# Patient Record
Sex: Female | Born: 1980 | Race: White | Hispanic: No | Marital: Married | Smoking: Former smoker
Health system: Southern US, Community
[De-identification: ages and names within clinical notes are randomized; demographics above are authoritative.]

## PROBLEM LIST (undated history)

## (undated) DIAGNOSIS — R87619 Unspecified abnormal cytological findings in specimens from cervix uteri: Secondary | ICD-10-CM

## (undated) HISTORY — PX: WISDOM TOOTH EXTRACTION: SHX21

## (undated) HISTORY — PX: COLPOSCOPY: SHX161

## (undated) HISTORY — PX: TYMPANOSTOMY TUBE PLACEMENT: SHX32

## (undated) HISTORY — PX: TONSILLECTOMY: SUR1361

## (undated) HISTORY — DX: Unspecified abnormal cytological findings in specimens from cervix uteri: R87.619

## (undated) HISTORY — PX: CERVICAL CONIZATION W/BX: SHX1330

## (undated) HISTORY — PX: ANKLE SURGERY: SHX546

---

## 2015-06-22 ENCOUNTER — Other Ambulatory Visit (HOSPITAL_COMMUNITY): Payer: Self-pay | Admitting: Nurse Practitioner

## 2015-06-22 ENCOUNTER — Ambulatory Visit (HOSPITAL_COMMUNITY)
Admission: RE | Admit: 2015-06-22 | Discharge: 2015-06-22 | Disposition: A | Discharge status: Home / Self Care | Payer: Medicaid Other | Source: Ambulatory Visit | Attending: Nurse Practitioner | Admitting: Nurse Practitioner

## 2015-06-22 DIAGNOSIS — M79671 Pain in right foot: Secondary | ICD-10-CM | POA: Diagnosis present

## 2016-03-13 ENCOUNTER — Encounter (HOSPITAL_COMMUNITY): Payer: Self-pay

## 2016-03-13 ENCOUNTER — Emergency Department (HOSPITAL_COMMUNITY): Payer: Medicaid Other

## 2016-03-13 ENCOUNTER — Emergency Department (HOSPITAL_COMMUNITY)
Admission: EM | Admit: 2016-03-13 | Discharge: 2016-03-14 | Disposition: A | Discharge status: Home / Self Care | Payer: Medicaid Other | Attending: Emergency Medicine | Admitting: Emergency Medicine

## 2016-03-13 DIAGNOSIS — N390 Urinary tract infection, site not specified: Secondary | ICD-10-CM | POA: Insufficient documentation

## 2016-03-13 DIAGNOSIS — R319 Hematuria, unspecified: Secondary | ICD-10-CM | POA: Diagnosis not present

## 2016-03-13 DIAGNOSIS — R1110 Vomiting, unspecified: Secondary | ICD-10-CM | POA: Diagnosis present

## 2016-03-13 DIAGNOSIS — R197 Diarrhea, unspecified: Secondary | ICD-10-CM | POA: Diagnosis not present

## 2016-03-13 LAB — DIFFERENTIAL
BASOS ABS: 0 10*3/uL (ref 0.0–0.1)
Basophils Relative: 0 %
EOS PCT: 0 %
Eosinophils Absolute: 0 10*3/uL (ref 0.0–0.7)
LYMPHS PCT: 11 %
Lymphs Abs: 1.2 10*3/uL (ref 0.7–4.0)
Monocytes Absolute: 0.6 10*3/uL (ref 0.1–1.0)
Monocytes Relative: 5 %
NEUTROS ABS: 9.5 10*3/uL — AB (ref 1.7–7.7)
NEUTROS PCT: 84 %

## 2016-03-13 LAB — URINALYSIS, ROUTINE W REFLEX MICROSCOPIC
Bilirubin Urine: NEGATIVE
Glucose, UA: NEGATIVE mg/dL
Ketones, ur: >80 mg/dL — AB
NITRITE: POSITIVE — AB
PH: 6 (ref 5.0–8.0)
Protein, ur: NEGATIVE mg/dL
Specific Gravity, Urine: 1.02 (ref 1.005–1.030)

## 2016-03-13 LAB — COMPREHENSIVE METABOLIC PANEL
ALT: 14 U/L (ref 14–54)
ANION GAP: 8 (ref 5–15)
AST: 23 U/L (ref 15–41)
Albumin: 4.3 g/dL (ref 3.5–5.0)
Alkaline Phosphatase: 53 U/L (ref 38–126)
BUN: 8 mg/dL (ref 6–20)
CHLORIDE: 104 mmol/L (ref 101–111)
CO2: 24 mmol/L (ref 22–32)
CREATININE: 0.86 mg/dL (ref 0.44–1.00)
Calcium: 9.1 mg/dL (ref 8.9–10.3)
Glucose, Bld: 97 mg/dL (ref 65–99)
Potassium: 3.9 mmol/L (ref 3.5–5.1)
Sodium: 136 mmol/L (ref 135–145)
Total Bilirubin: 0.6 mg/dL (ref 0.3–1.2)
Total Protein: 8.5 g/dL — ABNORMAL HIGH (ref 6.5–8.1)

## 2016-03-13 LAB — CBC
HCT: 40 % (ref 36.0–46.0)
Hemoglobin: 13 g/dL (ref 12.0–15.0)
MCH: 28.8 pg (ref 26.0–34.0)
MCHC: 32.5 g/dL (ref 30.0–36.0)
MCV: 88.5 fL (ref 78.0–100.0)
Platelets: 199 10*3/uL (ref 150–400)
RBC: 4.52 MIL/uL (ref 3.87–5.11)
RDW: 12.1 % (ref 11.5–15.5)
WBC: 11.5 10*3/uL — AB (ref 4.0–10.5)

## 2016-03-13 LAB — HCG, QUANTITATIVE, PREGNANCY

## 2016-03-13 LAB — LIPASE, BLOOD: LIPASE: 16 U/L (ref 11–51)

## 2016-03-13 LAB — URINE MICROSCOPIC-ADD ON

## 2016-03-13 MED ORDER — ONDANSETRON HCL 4 MG/2ML IJ SOLN
4.0000 mg | Freq: Once | INTRAMUSCULAR | Status: AC
  Administered 2016-03-13: 4 mg via INTRAVENOUS
  Filled 2016-03-13: qty 2

## 2016-03-13 MED ORDER — SODIUM CHLORIDE 0.9 % IV BOLUS (SEPSIS)
1000.0000 mL | Freq: Once | INTRAVENOUS | Status: AC
  Administered 2016-03-13: 1000 mL via INTRAVENOUS

## 2016-03-13 MED ORDER — DIATRIZOATE MEGLUMINE & SODIUM 66-10 % PO SOLN
ORAL | Status: AC
  Filled 2016-03-13: qty 30

## 2016-03-13 MED ORDER — SODIUM CHLORIDE 0.9 % IV SOLN
INTRAVENOUS | Status: DC
  Administered 2016-03-13: via INTRAVENOUS

## 2016-03-13 MED ORDER — ONDANSETRON HCL 4 MG/2ML IJ SOLN
4.0000 mg | Freq: Once | INTRAMUSCULAR | Status: AC | PRN
  Administered 2016-03-13: 4 mg via INTRAVENOUS
  Filled 2016-03-13: qty 2

## 2016-03-13 MED ORDER — MORPHINE SULFATE (PF) 4 MG/ML IV SOLN
4.0000 mg | Freq: Once | INTRAVENOUS | Status: AC
  Administered 2016-03-13: 4 mg via INTRAVENOUS
  Filled 2016-03-13: qty 1

## 2016-03-13 NOTE — ED Notes (Signed)
Pt reports 2 episodes of D/ since arriving to ED.

## 2016-03-13 NOTE — ED Provider Notes (Signed)
By signing my name below, I, [name], attest that this documentation has been prepared under the direction and in the presence of [name], DO. Electronically Signed: [name], ED Scribe. 03/13/2016. 11:56 PM.   TIME SEEN: 11:20 PM   CHIEF COMPLAINT: Abdominal pain  HPI:  Erin Melton is a 35 y.o. female with no significant past history who presents to the Emergency Department complaining of gradually worsening constant sharp left lower abdominal pain that intermittently moves to her right lower abdomen onset 2 days ago. She reports associated multiple episodes of non-bloody vomiting, one episode of non-bloody diarrhea, and a fever of 101.2 PTA. She adds that she has had nasal congestion for several days. No alleviating factors noted. Pt has tried motrin PTA with no significant relief. Pt receive Zofran here in the ED and denies any relief. She states that her LNMP was approx. 2 weeks ago. She explains that she had itchiness with codeine when she was younger. She denies a hx of STD or abdominal surgeries. She also denies any vaginal discharge, vaginal bleeding, or dysuria, hematuria. No recent sick contacts with similar symptoms or travel.  ROS: See HPI Constitutional: fever  Eyes: no drainage  ENT: no runny nose   Cardiovascular:  no chest pain  Resp: no SOB  GI: vomiting GU: no dysuria Integumentary: no rash  Allergy: no hives  Musculoskeletal: no leg swelling  Neurological: no slurred speech ROS otherwise negative  PAST MEDICAL HISTORY/PAST SURGICAL HISTORY:  History reviewed. No pertinent past medical history.  MEDICATIONS:  Prior to Admission medications   Medication Sig Start Date End Date Taking? Authorizing Provider  ibuprofen (ADVIL,MOTRIN) 800 MG tablet Take 800 mg by mouth every 8 (eight) hours as needed for mild pain.   Yes Historical Provider, MD  ondansetron (ZOFRAN) 4 MG tablet Take 4 mg by mouth every 8 (eight) hours as needed for nausea or  vomiting.   Yes Historical Provider, MD    ALLERGIES:  Allergies  Allergen Reactions  . Codeine Hives    SOCIAL HISTORY:  Social History  Substance Use Topics  . Smoking status: Never Smoker   . Smokeless tobacco: Not on file  . Alcohol Use: Not on file    FAMILY HISTORY: No family history on file.  EXAM: BP 122/82 mmHg  Pulse 87  Temp(Src) 98.6 F (37 C) (Oral)  Resp 14  Ht  (1.6 m)  Wt 153 lb (69.4 kg)  BMI 27.11 kg/m2  SpO2 100%  LMP 02/28/2016 CONSTITUTIONAL: Alert and oriented and responds appropriately to questions. Appears uncomfortable; well-nourished, afebrile, non-toxic HEAD: Normocephalic EYES: Conjunctivae clear, PERRL ENT: normal nose; no rhinorrhea; dry mucous membranes NECK: Supple, no meningismus, no LAD  CARD: RRR; S1 and S2 appreciated; no murmurs, no clicks, no rubs, no gallops RESP: Normal chest excursion without splinting or tachypnea; breath sounds clear and equal bilaterally; no wheezes, no rhonchi, no rales, no hypoxia or respiratory distress, speaking full sentences ABD/GI: Normal bowel sounds; non-distended; soft, tender in RLQ at McBurney's pt with voluntary guarding, no rebound, no peritoneal signs BACK:  The back appears normal and is non-tender to palpation, there is no CVA tenderness EXT: Normal ROM in all joints; non-tender to palpation; no edema; normal capillary refill; no cyanosis, no calf tenderness or swelling    SKIN: Normal color for age and race; warm; no rash NEURO: Moves all extremities equally, sensation to light touch intact diffusely, cranial nerves II through XII intact PSYCH: The patient's mood and manner  are appropriate. Grooming and personal hygiene are appropriate.  MEDICAL DECISION MAKING: Patient here with complaints of fever, abdominal pain, vomiting and diarrhea. Reports she is mostly tender in the left lower quadrant but on examination she is actually more tender to palpation in the right lower quadrant with  voluntary guarding at McBurney's point. Concern for possible appendicitis. No GU symptoms present. No flank pain. Labs show mild leukocytosis. She is not pregnant. Urine does show nitrite positive urinary tract infection which could also be contributing to her symptoms. We'll send urine culture. We'll obtain CT of her abdomen and pelvis to rule out appendicitis.  ED PROGRESS: 1:40 AM  Pt reports feeling much better. Her labs show mild leukocytosis with left shift. Urine shows a nitrite positive urinary tract infection. Culture is pending. CT scan does not show any acute intra-abdominal or pelvic pathology. Suboptimal visualization of the appendix but there is no inflammatory changes in the right lower quadrant. Ovaries appear normal. She does not have pelvic pain on exam. Most of her pain is suprapubic, right lower abdomen. She does report that she is having urinary frequency but again denies dysuria, hematuria. Suspect this pain is from her urinary tract infection. She is receiving IV ceftriaxone. She reports feeling better and is ready for discharge home. She does not currently have a PCP. We'll give her outpatient PCP follow-up information. Discussed return precautions with patient and significant other bedside.   At this time, I do not feel there is any life-threatening condition present. I have reviewed and discussed all results (EKG, imaging, lab, urine as appropriate), exam findings with patient. I have reviewed nursing notes and appropriate previous records.  I feel the patient is safe to be discharged home without further emergent workup. Discussed usual and customary return precautions. Patient and family (if present) verbalize understanding and are comfortable with this plan.  Patient will follow-up with their primary care provider. If they do not have a primary care provider, information for follow-up has been provided to them. All questions have been answered.    I personally performed the  services described in this documentation, which was scribed in my presence. The recorded information has been reviewed and is accurate.   Erin MawKristen N Heavin Sebree, DO 03/14/16 [id number]

## 2016-03-13 NOTE — ED Notes (Signed)
Fever and vomiting X2 days. Motrin PTA at [id number]

## 2016-03-14 MED ORDER — MORPHINE SULFATE (PF) 4 MG/ML IV SOLN
4.0000 mg | Freq: Once | INTRAVENOUS | Status: AC
  Administered 2016-03-14: 4 mg via INTRAVENOUS
  Filled 2016-03-14: qty 1

## 2016-03-14 MED ORDER — PHENAZOPYRIDINE HCL 200 MG PO TABS: 200.0000 mg | ORAL_TABLET | Freq: Three times a day (TID) | ORAL | Status: DC

## 2016-03-14 MED ORDER — DEXTROSE 5 % IV SOLN
1.0000 g | Freq: Once | INTRAVENOUS | Status: AC
  Administered 2016-03-14: 1 g via INTRAVENOUS
  Filled 2016-03-14: qty 10

## 2016-03-14 MED ORDER — IOPAMIDOL (ISOVUE-300) INJECTION 61%
100.0000 mL | Freq: Once | INTRAVENOUS | Status: AC | PRN
  Administered 2016-03-14: 100 mL via INTRAVENOUS

## 2016-03-14 MED ORDER — IBUPROFEN 800 MG PO TABS: 800.0000 mg | ORAL_TABLET | Freq: Three times a day (TID) | ORAL | Status: AC | PRN

## 2016-03-14 MED ORDER — ONDANSETRON 4 MG PO TBDP: 4.0000 mg | ORAL_TABLET | Freq: Three times a day (TID) | ORAL | Status: DC | PRN

## 2016-03-14 MED ORDER — CEPHALEXIN 500 MG PO CAPS: 500.0000 mg | ORAL_CAPSULE | Freq: Four times a day (QID) | ORAL | Status: DC

## 2016-03-14 NOTE — Discharge Instructions (Signed)

## 2016-03-16 LAB — URINE CULTURE

## 2016-03-17 ENCOUNTER — Telehealth (HOSPITAL_BASED_OUTPATIENT_CLINIC_OR_DEPARTMENT_OTHER): Payer: Self-pay | Admitting: Emergency Medicine

## 2016-03-17 NOTE — Telephone Encounter (Signed)
Post ED Visit - Positive Culture Follow-up  Culture report reviewed by antimicrobial stewardship pharmacist:  [x]  Erin Melton, Pharm.D. []  Erin Melton, Pharm.D., BCPS []  Erin Melton, Pharm.D. []  Erin Melton, Pharm.D., BCPS []  Erin Melton, [place] BCPS, AAHIVP []  Erin Melton, Pharm.D., BCPS, AAHIVP []  Erin Melton, Pharm.D. []  Erin Melton, [address]Pharm[address]  Positive urine culture Treated with  cephalexin, organism sensitive to the same and no further patient follow-up is required at this time.  Erin Melton, Erin Melton 03/17/2016, 9:57 AM

## 2016-03-28 ENCOUNTER — Ambulatory Visit (INDEPENDENT_AMBULATORY_CARE_PROVIDER_SITE_OTHER): Payer: Medicaid Other | Admitting: Obstetrics and Gynecology

## 2016-03-28 ENCOUNTER — Encounter: Payer: Self-pay | Admitting: Obstetrics and Gynecology

## 2016-03-28 VITALS — BP 120/74 | Ht 64.0 in | Wt 143.0 lb

## 2016-03-28 DIAGNOSIS — N814 Uterovaginal prolapse, unspecified: Secondary | ICD-10-CM

## 2016-03-28 DIAGNOSIS — N941 Unspecified dyspareunia: Secondary | ICD-10-CM | POA: Diagnosis not present

## 2016-03-28 DIAGNOSIS — R1030 Lower abdominal pain, unspecified: Secondary | ICD-10-CM

## 2016-03-28 DIAGNOSIS — R1032 Left lower quadrant pain: Secondary | ICD-10-CM

## 2016-03-28 NOTE — Progress Notes (Signed)
[facility] Visit  03/28/2016            Patient name: Erin Melton MRN [id number]  Date of birth: [birth year]  CC & HPI:  Erin Melton is a 35 y.o. female presenting today for follow up from ED visit on 03/13/2016 due to LLQ pain. Pt had a negative CT scan and UA culture and dx with k. penumoniae UTI. Pt was Rx 5 day keflex for her symptoms and she has finished her Rx at this time. Pt reports that she has had abdominal cramping x 8 months since placement of her nexplanon, pt had the nexplanon removed. Pt states that she has had 2 vaginal deliveries. Pt states that her abdominal pain feels similar to a contraction constantly throughout the day. Pt reports that her pain will intermittently last seconds to 30 minutes per episode and it is worsened at the end of the day. Pt has associated symptoms of dyspareunia. Pt has tried laxatives for the relief of her symptoms. Denies any other symptoms. Pt uses the pill as her contraceptive measures. Pt is in a stable marriage at this time.  ROS:  ROS +LLQ abdominal pain  Pertinent History Reviewed:   Reviewed: Significant for  Medical         Past Medical History  Diagnosis Date  . Abnormal Pap smear of cervix     at age 35                               Surgical Hx:    Past Surgical History  Procedure Laterality Date  . Tonsillectomy    . Cervical conization w/bx      at age 35   . Colposcopy    . Tympanostomy tube placement    . Tonsillectomy    . Ankle surgery    . Wisdom tooth extraction     Medications: Reviewed & Updated - see associated section                       Current outpatient prescriptions:  .  ibuprofen (ADVIL,MOTRIN) 800 MG tablet, Take 1 tablet (800 mg total) by mouth every 8 (eight) hours as needed for mild pain., Disp: 30 tablet, Rfl: 0 .  ondansetron (ZOFRAN ODT) 4 MG disintegrating tablet, Take 1 tablet (4 mg total) by mouth every 8 (eight) hours as needed for nausea or vomiting., Disp: 20 tablet, Rfl:  0   Social History: Reviewed -  reports that she has quit smoking. She has never used smokeless tobacco.  Objective Findings:  Vitals: Blood pressure 120/74, height 5\' 4"  (1.626 m), weight 143 lb (64.864 kg), last menstrual period 03/21/2016.  Physical Examination: Pelvic - normal external genitalia, vulva, vagina, cervix, uterus and adnexa, VULVA: normal appearing vulva with no masses, tenderness or lesions,  VAGINA: normal appearing vagina with normal color and discharge, no lesions,  CERVIX: normal appearing cervix without discharge or lesions, Cervix is somewhat enlarged prominent and easily contacted during pelvic exam. First-degree uterine descensus is present and uterus is mid position slightly anteflexed and elevation of the uterus seems to reproduce the pelvic discomfort as well as the dyspareunia UTERUS: uterus is normal size, shape, consistency and nontender, mid position 6 weeks size. Elevation of the uterus reproduces the discomfort  ADNEXA: normal adnexa in size, nontender and no masses   Assessment & Plan:   A:  1. Pelvic Relaxation, First-degree uterine  descensus. Pelvic discomfort due to this descensus 2. Dyspareunia secondary to uterine contact  P:  1. Anatomy and examination explained with visual guidance 2 Motrin when necessary position changes advised follow-up as needed for further discussion may bring partner for future exams   By signing my name below, I, Soijett Blue, attest that this documentation has been prepared under the direction and in the presence of Tilda BurrowJohn V Saryna Kneeland, MD. Electronically Signed: Soijett Blue, ED Scribe. 03/28/2016. 1:38 PM.  I personally performed the services described in this documentation, which was SCRIBED in my presence. The recorded information has been reviewed and considered accurate. It has been edited as necessary during review. Tilda BurrowFERGUSON,Daleisa Halperin V, MD

## 2016-03-28 NOTE — Progress Notes (Signed)
Patient ID: Erin Melton, female   DOB: 12-Jan-1981, 35 y.o.   MRN: [id number] Erin Melton states that she is still having abdominal, near both ovaries. Erin Melton states that a CT scan was done but no US.

## 2016-04-01 LAB — GC/CHLAMYDIA PROBE AMP
Chlamydia trachomatis, NAA: NEGATIVE
NEISSERIA GONORRHOEAE BY PCR: NEGATIVE

## 2016-07-07 ENCOUNTER — Other Ambulatory Visit: Payer: Self-pay | Admitting: Sports Medicine

## 2016-07-07 DIAGNOSIS — M25552 Pain in left hip: Secondary | ICD-10-CM

## 2016-07-07 DIAGNOSIS — R531 Weakness: Secondary | ICD-10-CM

## 2016-12-31 ENCOUNTER — Encounter (HOSPITAL_COMMUNITY): Payer: Self-pay | Admitting: Radiology

## 2016-12-31 ENCOUNTER — Inpatient Hospital Stay (HOSPITAL_COMMUNITY)
Admission: AD | Admit: 2016-12-31 | Discharge: 2017-01-01 | Disposition: A | Discharge status: Home / Self Care | Payer: BLUE CROSS/BLUE SHIELD | Source: Ambulatory Visit | Attending: Obstetrics and Gynecology | Admitting: Obstetrics and Gynecology

## 2016-12-31 ENCOUNTER — Inpatient Hospital Stay (HOSPITAL_COMMUNITY): Payer: BLUE CROSS/BLUE SHIELD

## 2016-12-31 DIAGNOSIS — A084 Viral intestinal infection, unspecified: Secondary | ICD-10-CM | POA: Diagnosis not present

## 2016-12-31 DIAGNOSIS — R1032 Left lower quadrant pain: Secondary | ICD-10-CM | POA: Diagnosis not present

## 2016-12-31 DIAGNOSIS — Z87891 Personal history of nicotine dependence: Secondary | ICD-10-CM | POA: Insufficient documentation

## 2016-12-31 DIAGNOSIS — K5730 Diverticulosis of large intestine without perforation or abscess without bleeding: Secondary | ICD-10-CM | POA: Insufficient documentation

## 2016-12-31 DIAGNOSIS — R109 Unspecified abdominal pain: Secondary | ICD-10-CM | POA: Diagnosis present

## 2016-12-31 LAB — CBC
HEMATOCRIT: 40.1 % (ref 36.0–46.0)
HEMOGLOBIN: 13.8 g/dL (ref 12.0–15.0)
MCH: 29.4 pg (ref 26.0–34.0)
MCHC: 34.4 g/dL (ref 30.0–36.0)
MCV: 85.3 fL (ref 78.0–100.0)
Platelets: 272 10*3/uL (ref 150–400)
RBC: 4.7 MIL/uL (ref 3.87–5.11)
RDW: 12.4 % (ref 11.5–15.5)
WBC: 11 10*3/uL — ABNORMAL HIGH (ref 4.0–10.5)

## 2016-12-31 LAB — COMPREHENSIVE METABOLIC PANEL
ALK PHOS: 77 U/L (ref 38–126)
ALT: 17 U/L (ref 14–54)
ANION GAP: 14 (ref 5–15)
AST: 25 U/L (ref 15–41)
Albumin: 4.3 g/dL (ref 3.5–5.0)
BILIRUBIN TOTAL: 1.4 mg/dL — AB (ref 0.3–1.2)
BUN: 13 mg/dL (ref 6–20)
CALCIUM: 9.5 mg/dL (ref 8.9–10.3)
CO2: 21 mmol/L — ABNORMAL LOW (ref 22–32)
Chloride: 102 mmol/L (ref 101–111)
Creatinine, Ser: 0.66 mg/dL (ref 0.44–1.00)
GFR calc non Af Amer: >60 mL/min (ref >60)
GLUCOSE: 96 mg/dL (ref 65–99)
Potassium: 3.6 mmol/L (ref 3.5–5.1)
Sodium: 137 mmol/L (ref 135–145)
TOTAL PROTEIN: 8.4 g/dL — AB (ref 6.5–8.1)

## 2016-12-31 LAB — URINALYSIS, ROUTINE W REFLEX MICROSCOPIC
Bilirubin Urine: NEGATIVE
GLUCOSE, UA: NEGATIVE mg/dL
Ketones, ur: 20 mg/dL — AB
NITRITE: NEGATIVE
Protein, ur: 30 mg/dL — AB
SPECIFIC GRAVITY, URINE: 1.025 (ref 1.005–1.030)
pH: 5 (ref 5.0–8.0)

## 2016-12-31 LAB — POCT PREGNANCY, URINE: Preg Test, Ur: NEGATIVE

## 2016-12-31 MED ORDER — DICYCLOMINE HCL 10 MG PO CAPS
20.0000 mg | ORAL_CAPSULE | Freq: Once | ORAL | Status: AC
  Administered 2016-12-31: 20 mg via ORAL
  Filled 2016-12-31: qty 2

## 2016-12-31 MED ORDER — IOPAMIDOL (ISOVUE-300) INJECTION 61%
100.0000 mL | Freq: Once | INTRAVENOUS | Status: AC | PRN
  Administered 2016-12-31: 100 mL via INTRAVENOUS

## 2016-12-31 MED ORDER — ONDANSETRON HCL 4 MG/2ML IJ SOLN
4.0000 mg | Freq: Once | INTRAMUSCULAR | Status: AC
  Administered 2016-12-31: 4 mg via INTRAVENOUS
  Filled 2016-12-31: qty 2

## 2016-12-31 MED ORDER — KETOROLAC TROMETHAMINE 60 MG/2ML IM SOLN
60.0000 mg | Freq: Once | INTRAMUSCULAR | Status: AC
  Administered 2016-12-31: 60 mg via INTRAMUSCULAR
  Filled 2016-12-31: qty 2

## 2016-12-31 MED ORDER — SODIUM CHLORIDE 0.9 % IV BOLUS (SEPSIS)
1000.0000 mL | Freq: Once | INTRAVENOUS | Status: AC
  Administered 2016-12-31: 1000 mL via INTRAVENOUS

## 2016-12-31 MED ORDER — HYDROMORPHONE HCL 1 MG/ML IJ SOLN
1.0000 mg | Freq: Once | INTRAMUSCULAR | Status: AC
Start: 2016-12-31 — End: 2016-12-31
  Administered 2016-12-31: 1 mg via INTRAVENOUS
  Filled 2016-12-31: qty 1

## 2016-12-31 NOTE — Discharge Instructions (Signed)
Food Choices to Help Relieve Diarrhea, Adult °When you have diarrhea, the foods you eat and your eating habits are very important. Choosing the right foods and drinks can help: °· Relieve diarrhea. °· Replace lost fluids and nutrients. °· Prevent dehydration. °What general guidelines should I follow? °Relieving diarrhea  °· Choose foods with less than 2 g or .07 oz. of fiber per serving. °· Limit fats to less than 8 tsp (38 g or 1.34 oz.) a day. °· Avoid the following: °¨ Foods and beverages sweetened with high-fructose corn syrup, honey, or sugar alcohols such as xylitol, sorbitol, and mannitol. °¨ Foods that contain a lot of fat or sugar. °¨ Fried, greasy, or spicy foods. °¨ High-fiber grains, breads, and cereals. °¨ Raw fruits and vegetables. °· Eat foods that are rich in probiotics. These foods include dairy products such as yogurt and fermented milk products. They help increase healthy bacteria in the stomach and intestines (gastrointestinal tract, or GI tract). °· If you have lactose intolerance, avoid dairy products. These may make your diarrhea worse. °· Take medicine to help stop diarrhea (antidiarrheal medicine) only as told by your health care provider. °Replacing nutrients  °· Eat small meals or snacks every 3-4 hours. °· Eat bland foods, such as white rice, toast, or baked potato, until your diarrhea starts to get better. Gradually reintroduce nutrient-rich foods as tolerated or as told by your health care provider. This includes: °¨ Well-cooked protein foods. °¨ Peeled, seeded, and soft-cooked fruits and vegetables. °¨ Low-fat dairy products. °· Take vitamin and mineral supplements as told by your health care provider. °Preventing dehydration  ° °· Start by sipping water or a special solution to prevent dehydration (oral rehydration solution, ORS). Urine that is clear or pale yellow means that you are getting enough fluid. °· Try to drink at least 8-10 cups of fluid each day to help replace lost  fluids. °· You may add other liquids in addition to water, such as clear juice or decaffeinated sports drinks, as tolerated or as told by your health care provider. °· Avoid drinks with caffeine, such as coffee, tea, or soft drinks. °· Avoid alcohol. °What foods are recommended? °The items listed may not be a complete list. Talk with your health care provider about what dietary choices are best for you. °Grains  °White rice. White, French, or pita breads (fresh or toasted), including plain rolls, buns, or bagels. White pasta. Saltine, soda, or graham crackers. Pretzels. Low-fiber cereal. Cooked cereals made with water (such as cornmeal, farina, or cream cereals). Plain muffins. Matzo. Melba toast. Zwieback. °Vegetables  °Potatoes (without the skin). Most well-cooked and canned vegetables without skins or seeds. Tender lettuce. °Fruits  °Apple sauce. Fruits canned in juice. Cooked apricots, cherries, grapefruit, peaches, pears, or plums. Fresh bananas and cantaloupe. °Meats and other protein foods  °Baked or boiled chicken. Eggs. Tofu. Fish. Seafood. Smooth nut butters. Ground or well-cooked tender beef, ham, veal, lamb, pork, or poultry. °Dairy  °Plain yogurt, kefir, and unsweetened liquid yogurt. Lactose-free milk, buttermilk, skim milk, or soy milk. Low-fat or nonfat hard cheese. °Beverages  °Water. Low-calorie sports drinks. Fruit juices without pulp. Strained tomato and vegetable juices. Decaffeinated teas. Sugar-free beverages not sweetened with sugar alcohols. Oral rehydration solutions, if approved by your health care provider. °Seasoning and other foods  °Bouillon, broth, or soups made from recommended foods. °What foods are not recommended? °The items listed may not be a complete list. Talk with your health care provider about what dietary choices   are best for you. Grains  Whole grain, whole wheat, bran, or rye breads, rolls, pastas, and crackers. Wild or brown rice. Whole grain or bran cereals. Barley.  Oats and oatmeal. Corn tortillas or taco shells. Granola. Popcorn. Vegetables  Raw vegetables. Fried vegetables. Cabbage, broccoli, Brussels sprouts, artichokes, baked beans, beet greens, corn, kale, legumes, peas, sweet potatoes, and yams. Potato skins. Cooked spinach and cabbage. Fruits  Dried fruit, including raisins and dates. Raw fruits. Stewed or dried prunes. Canned fruits with syrup. Meat and other protein foods  Fried or fatty meats. Deli meats. Chunky nut butters. Nuts and seeds. Beans and lentils. Tomasa Blase. Hot dogs. Sausage. Dairy  High-fat cheeses. Whole milk, chocolate milk, and beverages made with milk, such as milk shakes. Half-and-half. Cream. sour cream. Ice cream. Beverages  Caffeinated beverages (such as coffee, tea, soda, or energy drinks). Alcoholic beverages. Fruit juices with pulp. Prune juice. Soft drinks sweetened with high-fructose corn syrup or sugar alcohols. High-calorie sports drinks. Fats and oils  Butter. Cream sauces. Margarine. Salad oils. Plain salad dressings. Olives. Avocados. Mayonnaise. Sweets and desserts  Sweet rolls, doughnuts, and sweet breads. Sugar-free desserts sweetened with sugar alcohols such as xylitol and sorbitol. Seasoning and other foods  Honey. Hot sauce. Chili powder. Gravy. Cream-based or milk-based soups. Pancakes and waffles. Summary  When you have diarrhea, the foods you eat and your eating habits are very important.  Make sure you get at least 8-10 cups of fluid each day, or enough to keep your urine clear or pale yellow.  Eat bland foods and gradually reintroduce healthy, nutrient-rich foods as tolerated, or as told by your health care provider.  Avoid high-fiber, fried, greasy, or spicy foods. This information is not intended to replace advice given to you by your health care provider. Make sure you discuss any questions you have with your health care provider. Document Released: 12/13/2003 Document Revised: 09/19/2016 Document  Reviewed: 09/19/2016 Elsevier Interactive Patient Education  2017 [employer] Diverticulosis Diverticulosis is a condition that develops when small pouches (diverticula) form in the wall of the large intestine (colon). The colon is where water is absorbed and stool is formed. The pouches form when the inside layer of the colon pushes through weak spots in the outer layers of the colon. You may have a few pouches or many of them. What are the causes? The cause of this condition is not known. What increases the risk? The following factors may make you more likely to develop this condition:  Being older than age 15. Your risk for this condition increases with age. Diverticulosis is rare among people younger than age 59. By age 50, many people have it.  Eating a low-fiber diet.  Having frequent constipation.  Being overweight.  Not getting enough exercise.  Smoking.  Taking over-the-counter pain medicines, like aspirin and ibuprofen.  Having a family history of diverticulosis. What are the signs or symptoms? In most people, there are no symptoms of this condition. If you do have symptoms, they may include:  Bloating.  Cramps in the abdomen.  Constipation or diarrhea.  Pain in the lower left side of the abdomen. How is this diagnosed? This condition is most often diagnosed during an exam for other colon problems. Because diverticulosis usually has no symptoms, it often cannot be diagnosed independently. This condition may be diagnosed by:  Using a flexible scope to examine the colon (colonoscopy).  Taking an X-ray of the colon after dye has been put into the colon (barium enema).  Doing a CT scan. How is this treated? You may not need treatment for this condition if you have never developed an infection related to diverticulosis. If you have had an infection before, treatment may include:  Eating a high-fiber diet. This may include eating more fruits, vegetables, and  grains.  Taking a fiber supplement.  Taking a live bacteria supplement (probiotic).  Taking medicine to relax your colon.  Taking antibiotic medicines. Follow these instructions at home:  Drink 6-8 glasses of water or more each day to prevent constipation.  Try not to strain when you have a bowel movement.  If you have had an infection before:  Eat more fiber as directed by your health care provider or your diet and nutrition specialist (dietitian).  Take a fiber supplement or probiotic, if your health care provider approves.  Take over-the-counter and prescription medicines only as told by your health care provider.  If you were prescribed an antibiotic, take it as told by your health care provider. Do not stop taking the antibiotic even if you start to feel better.  Keep all follow-up visits as told by your health care provider. This is important. Contact a health care provider if:  You have pain in your abdomen.  You have bloating.  You have cramps.  You have not had a bowel movement in 3 days. Get help right away if:  Your pain gets worse.  Your bloating becomes very bad.  You have a fever or chills, and your symptoms suddenly get worse.  You vomit.  You have bowel movements that are bloody or black.  You have bleeding from your rectum. Summary  Diverticulosis is a condition that develops when small pouches (diverticula) form in the wall of the large intestine (colon).  You may have a few pouches or many of them.  This condition is most often diagnosed during an exam for other colon problems.  If you have had an infection related to diverticulosis, treatment may include increasing the fiber in your diet, taking supplements, or taking medicines. This information is not intended to replace advice given to you by your health care provider. Make sure you discuss any questions you have with your health care provider. Document Released: 06/19/2004 Document  Revised: 08/11/2016 Document Reviewed: 08/11/2016 Elsevier Interactive Patient Education  2017 [employer]

## 2016-12-31 NOTE — MAU Provider Note (Signed)
Chief Complaint: Abdominal Pain; Nausea; Emesis; and Diarrhea   First Provider Initiated Contact with Patient 12/31/16 1914      SUBJECTIVE HPI: Erin Melton is a 36 y.o. s/p vaginal hysterectomy 2 weeks ago who presents to maternity admissions reporting new onset of n/v/d at 2 am this morning. She reports vomiting 5-6 x with diarrhea 4-5 x since onset.  Abdominal pain started a few hours after her other symptoms and her pain is constant and worsening since onset.  It is low in her abdomen and radiates around to her left side.  She denies back pain.  She started treatment for UTI yesterday in the office but has been unable to keep down her abx today. She has not tried any treatments for her n/v/d other than sips of fluids/rest, which have not helped. There are no other associated symptoms.  She is crying, unable to find comfortable position with her pain in MAU. She denies vaginal bleeding, vaginal itching/burning, urinary symptoms, h/a, dizziness, or fever/chills.    HPI  Past Medical History:  Diagnosis Date  . Abnormal Pap smear of cervix    at age 71    Past Surgical History:  Procedure Laterality Date  . ANKLE SURGERY    . CERVICAL CONIZATION W/BX     at age 54   . COLPOSCOPY    . TONSILLECTOMY    . TONSILLECTOMY    . TYMPANOSTOMY TUBE PLACEMENT    . WISDOM TOOTH EXTRACTION     Social History   Social History  . Marital status: Married    Spouse name: N/A  . Number of children: N/A  . Years of education: N/A   Occupational History  . Not on file.   Social History Main Topics  . Smoking status: Former Games developer  . Smokeless tobacco: Never Used  . Alcohol use No  . Drug use: No  . Sexual activity: Yes    Birth control/ protection: Pill   Other Topics Concern  . Not on file   Social History Narrative  . No narrative on file   No current facility-administered medications on file prior to encounter.    Current Outpatient Prescriptions on File Prior to Encounter   Medication Sig Dispense Refill  . ibuprofen (ADVIL,MOTRIN) 800 MG tablet Take 1 tablet (800 mg total) by mouth every 8 (eight) hours as needed for mild pain. 30 tablet 0   Allergies  Allergen Reactions  . Codeine Hives    ROS:  Review of Systems  Constitutional: Negative for chills, fatigue and fever.  Respiratory: Negative for shortness of breath.   Cardiovascular: Negative for chest pain.  Gastrointestinal: Positive for abdominal pain, diarrhea, nausea and vomiting.  Genitourinary: Positive for pelvic pain. Negative for difficulty urinating, dysuria, flank pain, vaginal bleeding, vaginal discharge and vaginal pain.  Neurological: Negative for dizziness and headaches.  Psychiatric/Behavioral: Negative.      I have reviewed patient's Past Medical Hx, Surgical Hx, Family Hx, Social Hx, medications and allergies.   Physical Exam  Patient Vitals for the past 24 hrs:  BP Temp Temp src Pulse Resp  12/31/16 1849 108/65 - - (!) 104 -  12/31/16 1847 - 99.1 F (37.3 C) Oral - 18   Constitutional: Well-developed, well-nourished female in no acute distress.  Cardiovascular: normal rate Respiratory: normal effort GI: Abd soft, tenderness in suprapubic area and LLQ only, no rebound tenderness or guarding. Pos BS x 4 MS: Extremities nontender, no edema, normal ROM Neurologic: Alert and oriented x 4.  GU: Neg CVAT.  PELVIC EXAM: deferred   LAB RESULTS Results for orders placed or performed during the hospital encounter of 12/31/16 (from the past 24 hour(s))  Urinalysis, Routine w reflex microscopic     Status: Abnormal   Collection Time: 12/31/16  6:51 PM  Result Value Ref Range   Color, Urine AMBER (A) YELLOW   APPearance CLOUDY (A) CLEAR   Specific Gravity, Urine 1.025 1.005 - 1.030   pH 5.0 5.0 - 8.0   Glucose, UA NEGATIVE NEGATIVE mg/dL   Hgb urine dipstick MODERATE (A) NEGATIVE   Bilirubin Urine NEGATIVE NEGATIVE   Ketones, ur 20 (A) NEGATIVE mg/dL   Protein, ur 30 (A)  NEGATIVE mg/dL   Nitrite NEGATIVE NEGATIVE   Leukocytes, UA LARGE (A) NEGATIVE   RBC / HPF 6-30 0 - 5 RBC/hpf   WBC, UA TOO NUMEROUS TO COUNT 0 - 5 WBC/hpf   Bacteria, UA RARE (A) NONE SEEN   Squamous Epithelial / LPF TOO NUMEROUS TO COUNT (A) NONE SEEN   Mucous PRESENT   Pregnancy, urine POC     Status: None   Collection Time: 12/31/16  7:04 PM  Result Value Ref Range   Preg Test, Ur NEGATIVE NEGATIVE  CBC     Status: Abnormal   Collection Time: 12/31/16  7:36 PM  Result Value Ref Range   WBC 11.0 (H) 4.0 - 10.5 K/uL   RBC 4.70 3.87 - 5.11 MIL/uL   Hemoglobin 13.8 12.0 - 15.0 g/dL   HCT 16.1 09.6 - 04.5 %   MCV 85.3 78.0 - 100.0 fL   MCH 29.4 26.0 - 34.0 pg   MCHC 34.4 30.0 - 36.0 g/dL   RDW 40.9 81.1 - 91.4 %   Platelets 272 150 - 400 K/uL  Comprehensive metabolic panel     Status: Abnormal   Collection Time: 12/31/16  7:36 PM  Result Value Ref Range   Sodium 137 135 - 145 mmol/L   Potassium 3.6 3.5 - 5.1 mmol/L   Chloride 102 101 - 111 mmol/L   CO2 21 (L) 22 - 32 mmol/L   Glucose, Bld 96 65 - 99 mg/dL   BUN 13 6 - 20 mg/dL   Creatinine, Ser 7.82 0.44 - 1.00 mg/dL   Calcium 9.5 8.9 - 95.6 mg/dL   Total Protein 8.4 (H) 6.5 - 8.1 g/dL   Albumin 4.3 3.5 - 5.0 g/dL   AST 25 15 - 41 U/L   ALT 17 14 - 54 U/L   Alkaline Phosphatase 77 38 - 126 U/L   Total Bilirubin 1.4 (H) 0.3 - 1.2 mg/dL   GFR calc non Af Amer >60 >60 mL/min   GFR calc Af Amer >60 >60 mL/min   Anion gap 14 5 - 15       IMAGING Ct Abdomen Pelvis W Contrast  Result Date: 12/31/2016 CLINICAL DATA:  Abdominal pain left lower quadrant.  Hysterectomy. EXAM: CT ABDOMEN AND PELVIS WITH CONTRAST TECHNIQUE: Multidetector CT imaging of the abdomen and pelvis was performed using the standard protocol following bolus administration of intravenous contrast. CONTRAST:  ISOVUE-300 IOPAMIDOL (ISOVUE-300) INJECTION 61% COMPARISON:  CT abdomen pelvis 03/14/2016 FINDINGS: Lower chest: Lung bases clear Hepatobiliary:  1 cm hypodensity in the posterior right lobe liver unchanged from the prior study. No other liver lesions. Gallbladder and bile ducts normal. Pancreas: Negative for mass or edema. Spleen: Negative Adrenals/Urinary Tract: Negative. No renal mass or obstruction. No urinary tract calculi. Stomach/Bowel: Normal stomach and duodenum. Negative for  bowel obstruction. Mild sigmoid diverticulosis without diverticulitis. Appendix not visualized. Vascular/Lymphatic: Negative Reproductive: Hysterectomy.  18 mm cyst left ovary. Other: No free fluid. Musculoskeletal: Mild scoliosis.  No acute skeletal lesion. IMPRESSION: Sigmoid diverticulosis without diverticulitis Appendix not visualized 18 mm cyst left ovary.  No pelvic mass. Electronically Signed   By: Marlan Palauharles  Clark M.D.   On: 12/31/2016 21:51   MAU Management/MDM: Ordered CBC, CMP, u/a and reviewed results.  NS x 1000 ml, Zofran 4 mg IV, and Dilaudid 1 mg IV given.  Consult Dr Elon SpannerLeger with results and assessment.  CT scan ordered.  Report to Thressa ShellerHeather Maresa Morash, CNM, with CT results pending. 2212: D/W Dr. Elon SpannerLeger, will try bentyl and toradol for pain as well as offer patient something PO. If her pain is improved then she can be DC home to FU in the office.  2336: Patient has had bentyl and toradol. She reports her pain as 2/10 at this time. She is tolerating PO food and fluids at this time.    Sharen CounterLisa Leftwich-Kirby Certified Nurse-Midwife 12/31/2016  8:49 PM  1. Diverticulosis of colon without hemorrhage   2. Acute abdominal pain in left lower quadrant   3. Viral gastroenteritis    DC home Comfort measures reviewed  BRAT Diet  RX: none, FU with primary OB for further plan of care.  Return to MAU as needed FU with OB as planned  Follow-up Information    [facility] IMAGING Follow up.   Specialty:  Radiology Contact information: [address] [id number] [place] [id number] [phone]       Ranae PilaElise  Jennifer Leger, MD Follow up.   Specialty:  Obstetrics and Gynecology Contact information: [address] [place] [id number] [phone]

## 2016-12-31 NOTE — MAU Note (Signed)
Patient states that she woke up this morning with N/V/D and lower abdominal pain. Patient states that the pain is constant.

## 2018-01-25 IMAGING — CT CT ABD-PELV W/ CM
1 of 3 series · 14 of 32 positions shown, 19 images · IV contrast (OMNIPAQUE)
Comparison: CT abdomen pelvis 03/14/2016

CLINICAL DATA: Abdominal pain left lower quadrant.  Hysterectomy.

EXAM:
CT ABDOMEN AND PELVIS WITH CONTRAST
TECHNIQUE: Multidetector CT imaging of the abdomen and pelvis was performed
using the standard protocol following bolus administration of
intravenous contrast.
CONTRAST:  100mL [id number] IOPAMIDOL ([id number]) INJECTION 61%

[Series 2: routine abdomen/pelvis with · axial · 0.73mm/px · z∈[-257,+143]mm · 14 of 90 slices shown, 19 images]
[im 5/90  soft-tissue]
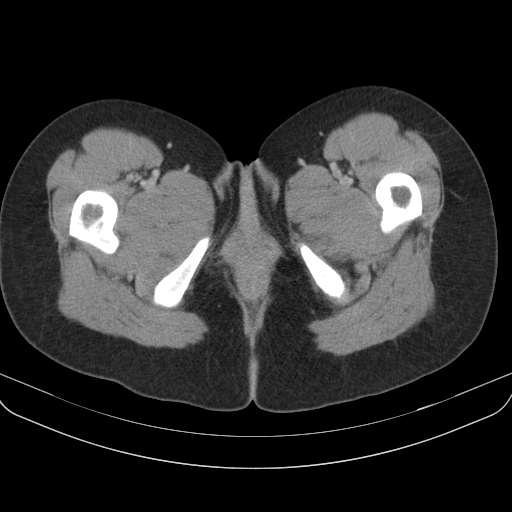
[im 5/90  bone]
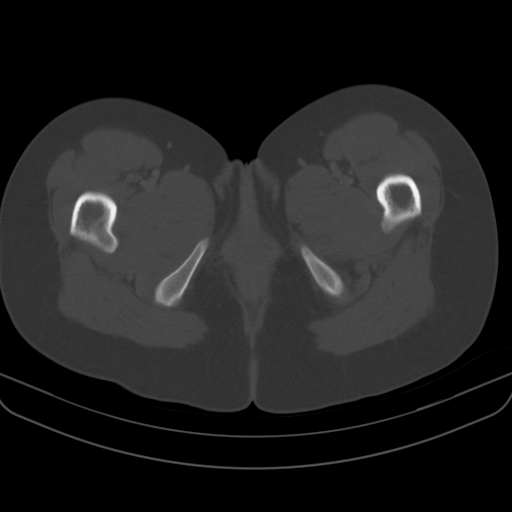
[im 10/90  soft-tissue]
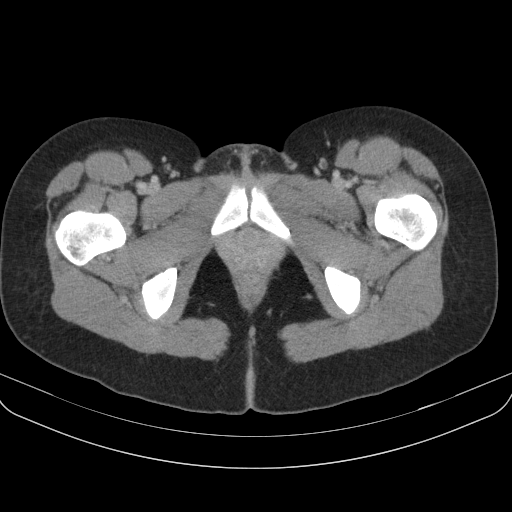
[im 20/90  soft-tissue]
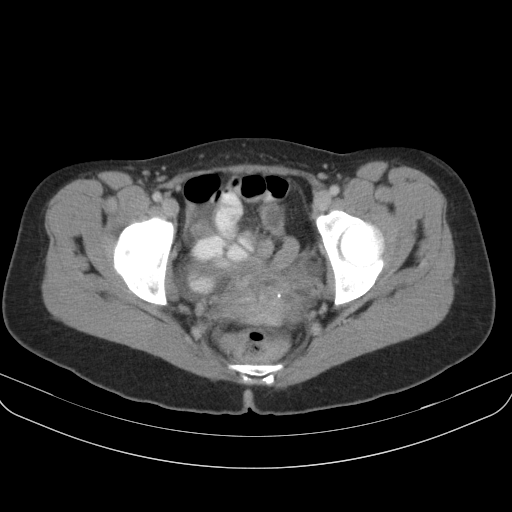
[im 25/90  soft-tissue]
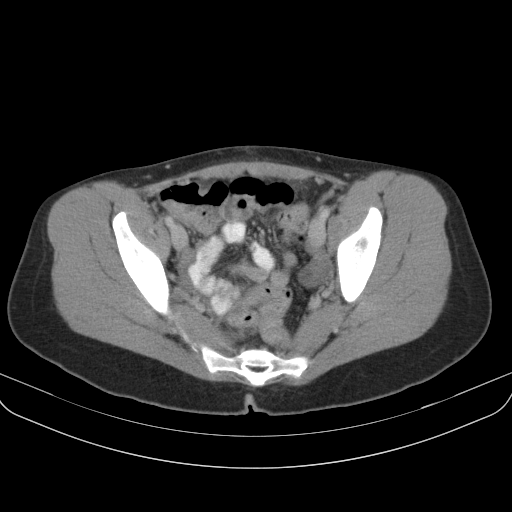
[im 30/90  soft-tissue]
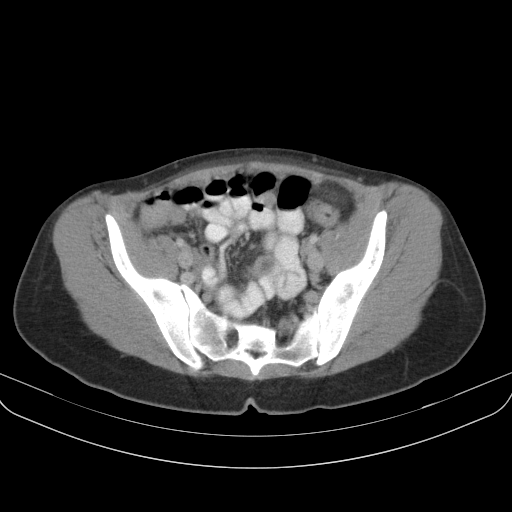
[im 40/90  soft-tissue]
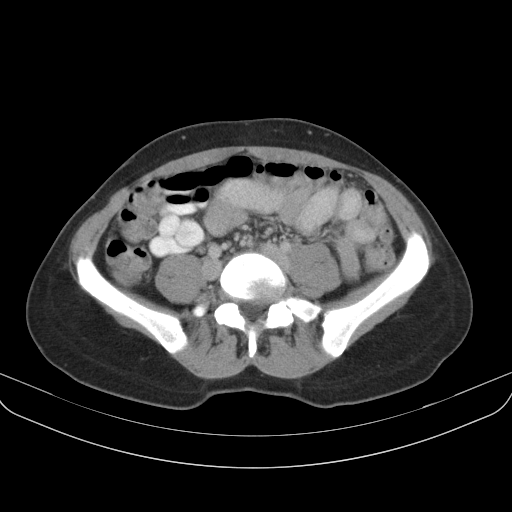
[im 45/90  soft-tissue]
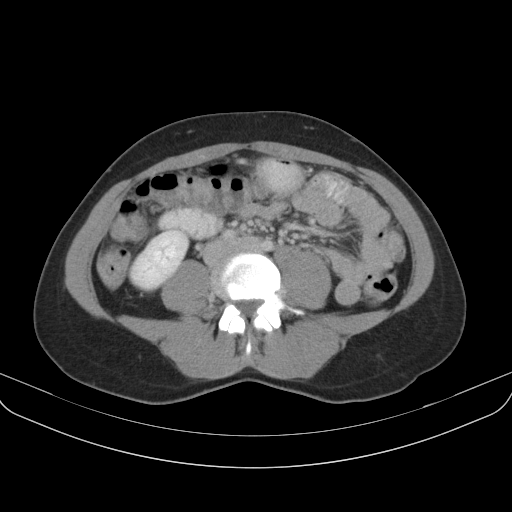
[im 50/90  soft-tissue]
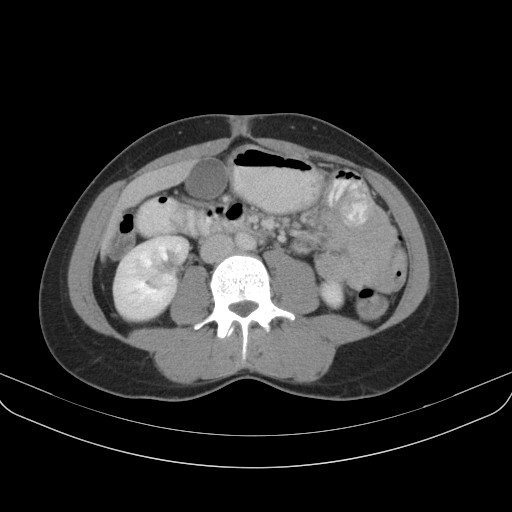
[im 60/90  soft-tissue]
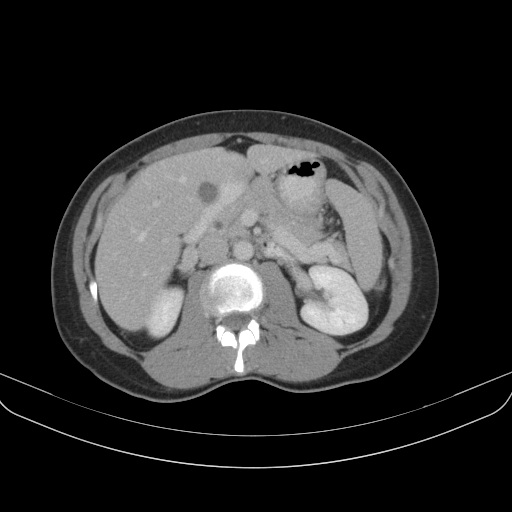
[im 60/90  bone]
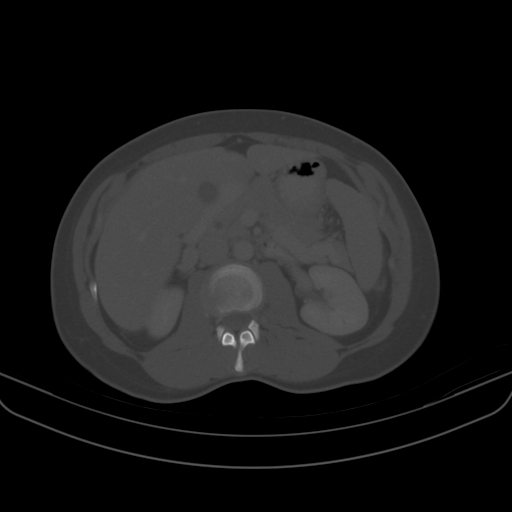
[im 65/90  soft-tissue]
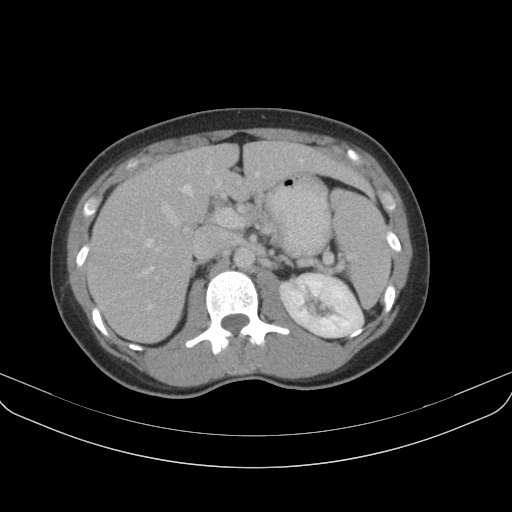
[im 70/90  soft-tissue]
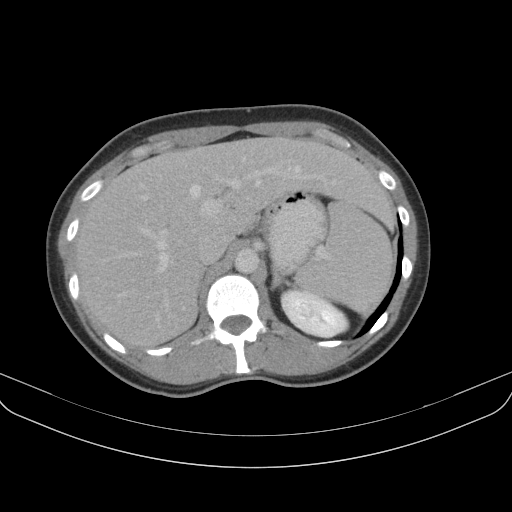
[im 70/90  lung]
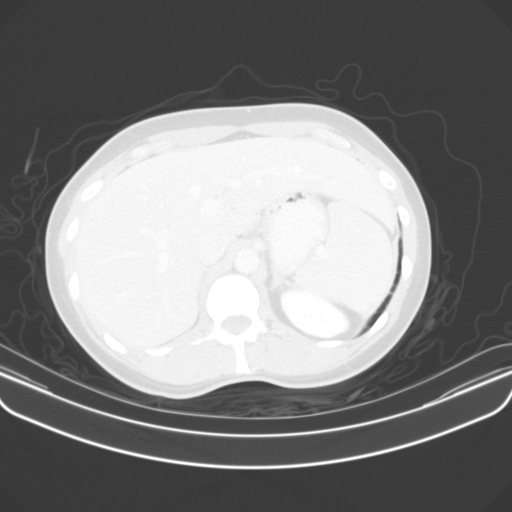
[im 75/90  lung]
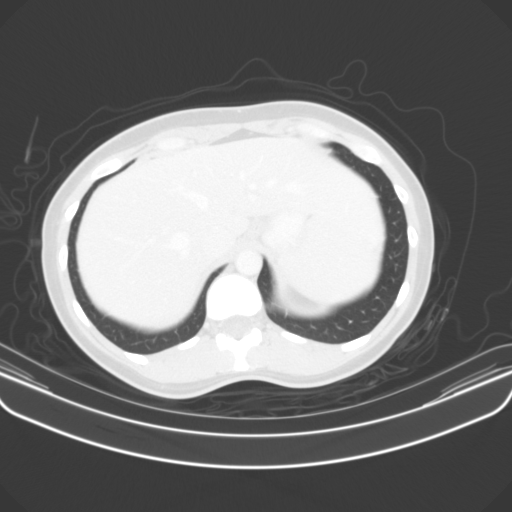
[im 80/90  soft-tissue]
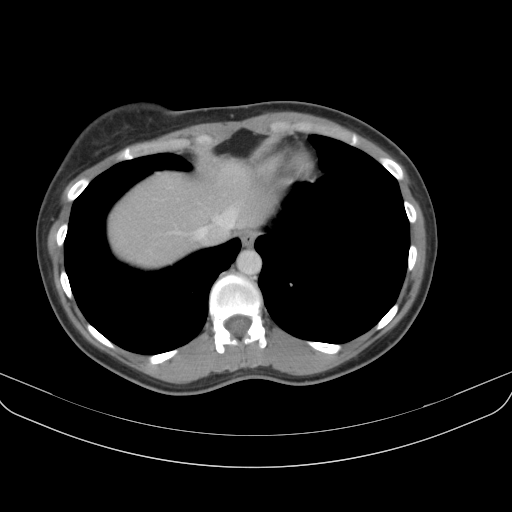
[im 80/90  lung]
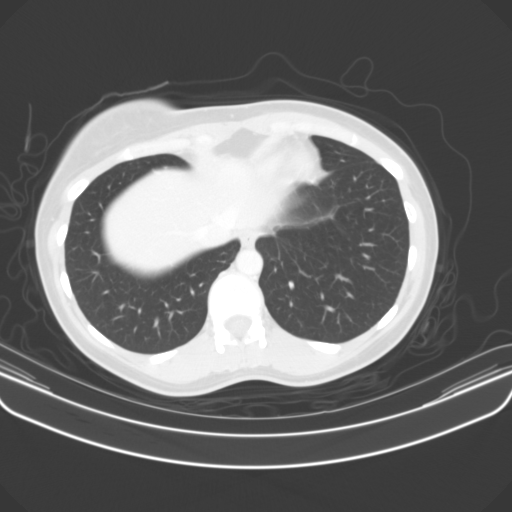
[im 85/90  soft-tissue]
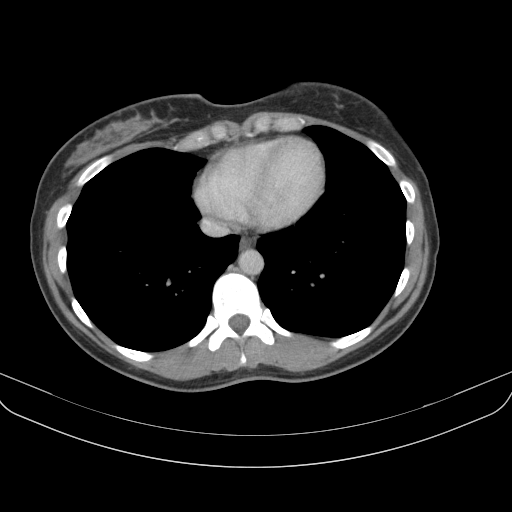
[im 85/90  lung]
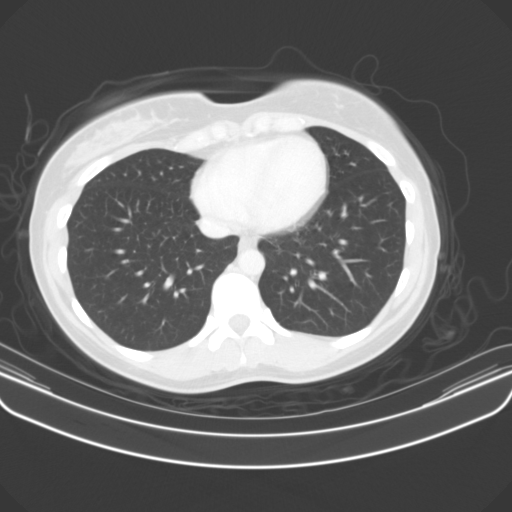

[14 of 32 positions shown; findings below may reference images not displayed]

FINDINGS: Lower chest: Lung bases clear

Hepatobiliary: 1 cm hypodensity in the posterior right lobe liver
unchanged from the prior study. No other liver lesions. Gallbladder
and bile ducts normal.

Pancreas: Negative for mass or edema.

Spleen: Negative

Adrenals/Urinary Tract: Negative. No renal mass or obstruction. No
urinary tract calculi.

Stomach/Bowel: Normal stomach and duodenum. Negative for bowel
obstruction. Mild sigmoid diverticulosis without diverticulitis.
Appendix not visualized.

Vascular/Lymphatic: Negative

Reproductive: Hysterectomy.  18 mm cyst left ovary.

Other: No free fluid.

Musculoskeletal: Mild scoliosis.  No acute skeletal lesion.
IMPRESSION: Sigmoid diverticulosis without diverticulitis

Appendix not visualized

18 mm cyst left ovary.  No pelvic mass.

## 2018-04-10 ENCOUNTER — Other Ambulatory Visit: Payer: Self-pay | Admitting: Oncology

## 2022-09-18 ENCOUNTER — Telehealth: Payer: Self-pay

## 2022-09-18 NOTE — Telephone Encounter (Signed)
Error
